# Patient Record
Sex: Male | Born: 1985 | Race: White | Hispanic: No | Marital: Single | Smoking: Current every day smoker
Health system: Southern US, Community
[De-identification: ages and names within clinical notes are randomized; demographics above are authoritative.]

---

## 2013-07-03 ENCOUNTER — Emergency Department (INDEPENDENT_AMBULATORY_CARE_PROVIDER_SITE_OTHER): Payer: 59

## 2013-07-03 ENCOUNTER — Encounter (HOSPITAL_COMMUNITY): Payer: Self-pay | Admitting: Emergency Medicine

## 2013-07-03 ENCOUNTER — Emergency Department (HOSPITAL_COMMUNITY)
Admission: EM | Admit: 2013-07-03 | Discharge: 2013-07-03 | Disposition: A | Discharge status: Home / Self Care | Payer: 59 | Source: Home / Self Care | Attending: Family Medicine | Admitting: Family Medicine

## 2013-07-03 DIAGNOSIS — I951 Orthostatic hypotension: Secondary | ICD-10-CM

## 2013-07-03 DIAGNOSIS — J111 Influenza due to unidentified influenza virus with other respiratory manifestations: Secondary | ICD-10-CM

## 2013-07-03 DIAGNOSIS — R69 Illness, unspecified: Secondary | ICD-10-CM

## 2013-07-03 LAB — POCT RAPID STREP A: STREPTOCOCCUS, GROUP A SCREEN (DIRECT): NEGATIVE

## 2013-07-03 MED ORDER — GUAIFENESIN-CODEINE 100-10 MG/5ML PO SOLN: 5.0000 mL | Freq: Every evening | ORAL | Status: DC | PRN

## 2013-07-03 MED ORDER — SODIUM CHLORIDE 0.9 % IV BOLUS (SEPSIS)
1000.0000 mL | Freq: Once | INTRAVENOUS | Status: AC
  Administered 2013-07-03: 1000 mL via INTRAVENOUS

## 2013-07-03 MED ORDER — IPRATROPIUM BROMIDE 0.06 % NA SOLN: 2.0000 | Freq: Four times a day (QID) | NASAL | Status: DC

## 2013-07-03 NOTE — Discharge Instructions (Signed)
Thank you for coming in today. Take Tylenol or ibuprofen. Use Atrovent nasal spray. His containing cough medication as needed. To the emergency room or come back if getting worse. Followup with your primary care provider. Drink plenty of fluids. Call or go to the emergency room if you get worse, have trouble breathing, have chest pains, or palpitations.

## 2013-07-03 NOTE — ED Notes (Signed)
IV bolus continues infusing.  Comfort measures offered.

## 2013-07-03 NOTE — ED Notes (Signed)
Fevers up to 103, productive cough that is now non-productive, sore throat since Friday.  Emesis x 2.  Has been taking IBU, Alka Seltzer and Night/Day Combo Cold Med.  Last dose IBU @ 1330 today.

## 2013-07-03 NOTE — ED Provider Notes (Signed)
William LovelyMatthew B Drake is a 28 y.o. male who presents to Urgent Care today for fevers chills body aches congestion and a mildly productive cough. This is also associated with 2 episodes of vomiting earlier in the week. Patient has been ill now for 3 days. No shortness of breath or diarrhea her current vomiting. No chest pains palpitations or abdominal pain. Patient feels well otherwise. He did not receive a flu vaccination yet this year.   History reviewed. No pertinent past medical history. History  Substance Use Topics  . Smoking status: Current Every Day Smoker  . Smokeless tobacco: Not on file  . Alcohol Use: Yes   ROS as above Medications reviewed. No current facility-administered medications for this encounter.   Current Outpatient Prescriptions  Medication Sig Dispense Refill  . guaiFENesin-codeine 100-10 MG/5ML syrup Take 5 mLs by mouth at bedtime as needed for cough.  120 mL  0  . ipratropium (ATROVENT) 0.06 % nasal spray Place 2 sprays into both nostrils 4 (four) times daily.  15 mL  1    Exam:  BP 119/68  Pulse 100  Temp(Src) 98.5 F (36.9 C) (Oral)  Resp 18  SpO2 100% Filed Vitals:   07/03/13 1725 07/03/13 1735 07/03/13 1736 07/03/13 1855  BP: 122/67 124/89 90/60 119/68  Pulse: 104 104 143 100  Temp:    98.5 F (36.9 C)  TempSrc:    Oral  Resp:      SpO2:    100%   Heart rate was initially 143 beats per minute.   Gen: Well NAD HEENT: EOMI,  MMM posterior pharynx normal-appearing. Tympanic membranes are normal appearing. Lungs: Normal work of breathing. CTABL Heart: Tachycardia but regular no MRG Abd: NABS, Soft. NT, ND Exts: Non edematous BL  LE, warm and well perfused.   Patient was given a 1 L normal saline bolus and had normalization of heart rate  Results for orders placed during the hospital encounter of 07/03/13 (from the past 24 hour(s))  POCT RAPID STREP A ([facility] ONLY)     Status: None   Collection Time    07/03/13  5:29 PM      Result Value  Range   Streptococcus, Group A Screen (Direct) NEGATIVE  NEGATIVE   Dg Chest 2 View  07/03/2013   CLINICAL DATA:  FEVER cough, smoker for 10 years  EXAM: CHEST  2 VIEW  COMPARISON:  No comparisons  FINDINGS: There is left mid lung linear airspace disease likely representing discoid atelectasis versus scarring. There is no focal parenchymal opacity, pleural effusion, or pneumothorax. The heart and mediastinal contours are unremarkable.  The osseous structures are unremarkable.  IMPRESSION: No active cardiopulmonary disease.   Electronically Signed   By: Elige KoHetal  Patel   On: 07/03/2013 17:52    Assessment and Plan: 28 y.o. male with orthostatic hypotension with influenza-like illness. Patient's heart rate improved significantly with 1 L normal saline bolus. Plan to treat symptomatically with codeine cough medication ipratropium nasal spray, and ibuprofen or Tylenol. Followup with primary care provider. Discussed warning signs or symptoms. Please see discharge instructions. Patient expresses understanding.      Rodolph BongEvan S Corey, MD 07/03/13 1900

## 2013-07-03 NOTE — ED Notes (Signed)
Comfort measures provided ?

## 2013-07-06 LAB — CULTURE, GROUP A STREP

## 2014-04-12 ENCOUNTER — Ambulatory Visit: Payer: Self-pay | Admitting: Family Medicine

## 2016-11-10 ENCOUNTER — Emergency Department (HOSPITAL_COMMUNITY): Payer: 59

## 2016-11-10 ENCOUNTER — Encounter (HOSPITAL_COMMUNITY): Payer: Self-pay

## 2016-11-10 ENCOUNTER — Encounter (HOSPITAL_COMMUNITY): Payer: Self-pay | Admitting: Emergency Medicine

## 2016-11-10 ENCOUNTER — Ambulatory Visit (HOSPITAL_COMMUNITY)
Admission: EM | Admit: 2016-11-10 | Discharge: 2016-11-10 | Disposition: A | Discharge status: Nursing Home (Includes ICF) | Payer: 59 | Attending: Internal Medicine | Admitting: Internal Medicine

## 2016-11-10 ENCOUNTER — Emergency Department (HOSPITAL_COMMUNITY)
Admission: EM | Admit: 2016-11-10 | Discharge: 2016-11-11 | Disposition: A | Discharge status: Home / Self Care | Payer: 59 | Attending: Emergency Medicine | Admitting: Emergency Medicine

## 2016-11-10 DIAGNOSIS — F17200 Nicotine dependence, unspecified, uncomplicated: Secondary | ICD-10-CM | POA: Diagnosis not present

## 2016-11-10 DIAGNOSIS — R112 Nausea with vomiting, unspecified: Secondary | ICD-10-CM | POA: Diagnosis not present

## 2016-11-10 DIAGNOSIS — R1031 Right lower quadrant pain: Secondary | ICD-10-CM | POA: Diagnosis present

## 2016-11-10 DIAGNOSIS — R1084 Generalized abdominal pain: Secondary | ICD-10-CM | POA: Insufficient documentation

## 2016-11-10 LAB — CBC
HCT: 48 % (ref 39.0–52.0)
Hemoglobin: 16.1 g/dL (ref 13.0–17.0)
MCH: 30.4 pg (ref 26.0–34.0)
MCHC: 33.5 g/dL (ref 30.0–36.0)
MCV: 90.7 fL (ref 78.0–100.0)
Platelets: 314 10*3/uL (ref 150–400)
RBC: 5.29 MIL/uL (ref 4.22–5.81)
RDW: 13.2 % (ref 11.5–15.5)
WBC: 12.9 10*3/uL — ABNORMAL HIGH (ref 4.0–10.5)

## 2016-11-10 LAB — URINALYSIS, ROUTINE W REFLEX MICROSCOPIC
Bilirubin Urine: NEGATIVE
Glucose, UA: NEGATIVE mg/dL
Hgb urine dipstick: NEGATIVE
Ketones, ur: NEGATIVE mg/dL
LEUKOCYTES UA: NEGATIVE
NITRITE: NEGATIVE
PH: 6 (ref 5.0–8.0)
Protein, ur: NEGATIVE mg/dL
Specific Gravity, Urine: 1.031 — ABNORMAL HIGH (ref 1.005–1.030)

## 2016-11-10 LAB — COMPREHENSIVE METABOLIC PANEL
ALT: 57 U/L (ref 17–63)
ANION GAP: 10 (ref 5–15)
AST: 31 U/L (ref 15–41)
Albumin: 4.5 g/dL (ref 3.5–5.0)
Alkaline Phosphatase: 104 U/L (ref 38–126)
BILIRUBIN TOTAL: 0.9 mg/dL (ref 0.3–1.2)
BUN: 13 mg/dL (ref 6–20)
CO2: 27 mmol/L (ref 22–32)
Calcium: 9.1 mg/dL (ref 8.9–10.3)
Chloride: 102 mmol/L (ref 101–111)
Creatinine, Ser: 1.03 mg/dL (ref 0.61–1.24)
GFR calc Af Amer: >60 mL/min (ref >60)
GFR calc non Af Amer: >60 mL/min (ref >60)
Glucose, Bld: 101 mg/dL — ABNORMAL HIGH (ref 65–99)
Potassium: 4.3 mmol/L (ref 3.5–5.1)
Sodium: 139 mmol/L (ref 135–145)
TOTAL PROTEIN: 7.3 g/dL (ref 6.5–8.1)

## 2016-11-10 LAB — LIPASE, BLOOD: LIPASE: 17 U/L (ref 11–51)

## 2016-11-10 MED ORDER — IOPAMIDOL (ISOVUE-300) INJECTION 61%
100.0000 mL | Freq: Once | INTRAVENOUS | Status: AC | PRN
  Administered 2016-11-10: 100 mL via INTRAVENOUS

## 2016-11-10 MED ORDER — ONDANSETRON 4 MG PO TBDP
8.0000 mg | ORAL_TABLET | Freq: Once | ORAL | Status: AC
Start: 2016-11-10 — End: 2016-11-10
  Administered 2016-11-10: 8 mg via ORAL
  Filled 2016-11-10: qty 2

## 2016-11-10 MED ORDER — PANTOPRAZOLE SODIUM 40 MG IV SOLR
40.0000 mg | Freq: Once | INTRAVENOUS | Status: AC
  Administered 2016-11-10: 40 mg via INTRAVENOUS
  Filled 2016-11-10: qty 40

## 2016-11-10 NOTE — ED Notes (Signed)
CT in 30 more minutes, pt advised

## 2016-11-10 NOTE — ED Notes (Addendum)
Pt still feeling nauseous after Zofran and taking small sips of fluids.  PA [place] notified

## 2016-11-10 NOTE — ED Notes (Signed)
Pt aware of need for urine. Pt given urinal

## 2016-11-10 NOTE — ED Provider Notes (Signed)
[facility] Provider Note   CSN: [id number] Arrival date & time: 11/10/16  1248     History   Chief Complaint Chief Complaint  Patient presents with  . Abdominal Pain    HPI William Drake is a 31 y.o. male.  Patient reports from urgent care with right lower quadrant abdominal pain and post prandial emesis that began on Saturday. Patient states he has a normal appetite and attempts to eat a meal but has immediate nausea and forceful vomiting following meal. States abdominal pain is achy, mild, intermittent. Patient states he has associated fevers, and red dots on face from vomiting.. Reports his last BM was this morning. Reports recent illness of bronchitis last week. No history of abdominal surgery. States he smokes 6-7 cigarettes per day.      History reviewed. No pertinent past medical history.  There are no active problems to display for this patient.   History reviewed. No pertinent surgical history.     Home Medications    Prior to Admission medications   Medication Sig Start Date End Date Taking? Authorizing Provider  albuterol (PROVENTIL HFA;VENTOLIN HFA) 108 (90 Base) MCG/ACT inhaler Inhale 1-2 puffs into the lungs every 6 (six) hours as needed for wheezing or shortness of breath.   Yes [provider]  omeprazole (PRILOSEC) 20 MG capsule Take 1 capsule (20 mg total) by mouth daily. 11/11/16   Russo, [place] N, PA-C  ondansetron (ZOFRAN ODT) 8 MG disintegrating tablet Take 1 tablet (8 mg total) by mouth every 8 (eight) hours as needed for nausea or vomiting. 11/11/16   Russo, [place] N, PA-C    Family History No family history on file.  Social History Social History  Substance Use Topics  . Smoking status: Current Every Day Smoker  . Smokeless tobacco: Never Used  . Alcohol use Yes     Allergies   Patient has no known allergies.   Review of Systems Review of Systems  Constitutional: Positive for fever. Negative for appetite change.   Gastrointestinal: Positive for abdominal pain, nausea and vomiting.  Genitourinary: Negative for difficulty urinating, dysuria and frequency.  Allergic/Immunologic: Negative for immunocompromised state.     Physical Exam Updated Vital Signs BP (!) 107/53   Pulse 82   Temp 98.1 F (36.7 C) (Oral)   Resp 18   Ht 5\' 7"  (1.702 m)   Wt 112.5 kg   SpO2 97%   BMI 38.84 kg/m   Physical Exam  Constitutional: He appears well-developed and well-nourished. No distress.  HENT:  Head: Normocephalic and atraumatic.  Petechiae noted on forehead, eyelids, cheeks  Eyes: Conjunctivae are normal.  Cardiovascular: Normal rate, regular rhythm, normal heart sounds and intact distal pulses.  Exam reveals no gallop and no friction rub.   No murmur heard. Pulmonary/Chest: Effort normal and breath sounds normal. No respiratory distress. He has no wheezes. He has no rales.  Abdominal: Normal appearance and bowel sounds are normal. He exhibits no distension. There is tenderness in the right lower quadrant, epigastric area, periumbilical area and left lower quadrant. There is rebound and tenderness at McBurney's point. There is no rigidity, no guarding and negative Murphy's sign.  Tenderness is mild  Psychiatric: He has a normal mood and affect. His behavior is normal.  Nursing note and vitals reviewed.    ED Treatments / Results  Labs (all labs ordered are listed, but only abnormal results are displayed) Labs Reviewed  COMPREHENSIVE METABOLIC PANEL - Abnormal; Notable for the following:  Result Value   Glucose, Bld 101 (*)    All other components within normal limits  CBC - Abnormal; Notable for the following:    WBC 12.9 (*)    All other components within normal limits  URINALYSIS, ROUTINE W REFLEX MICROSCOPIC - Abnormal; Notable for the following:    Color, Urine AMBER (*)    Specific Gravity, Urine 1.031 (*)    All other components within normal limits  LIPASE, BLOOD    EKG  EKG  Interpretation None       Radiology Ct Abdomen Pelvis W Contrast  Result Date: 11/10/2016 CLINICAL DATA:  Initial evaluation for acute right lower quadrant abdominal pain. EXAM: CT ABDOMEN AND PELVIS WITH CONTRAST TECHNIQUE: Multidetector CT imaging of the abdomen and pelvis was performed using the standard protocol following bolus administration of intravenous contrast. CONTRAST:  100mL ISOVUE-300 IOPAMIDOL (ISOVUE-300) INJECTION 61% COMPARISON:  None available. FINDINGS: Lower chest: Visualized lung bases are clear. Hepatobiliary: Diffuse hypoattenuation liver consistent with steatosis. Focal fatty sparing noted within the left hepatic lobe. Liver otherwise unremarkable. Gallbladder within normal limits. No biliary dilatation. Pancreas: Pancreas within normal limits. Spleen: Spleen within normal limits. Adrenals/Urinary Tract: Adrenal glands are normal. Kidneys equal in size with symmetric enhancement. No nephrolithiasis, hydronephrosis, or focal enhancing renal mass. No hydroureter. Bladder largely decompressed without acute abnormality. Stomach/Bowel: Stomach within normal limits. No evidence for bowel obstruction. Appendix well visualized within the right lower quadrant and is within normal limits without acute inflammatory changes to suggest acute appendicitis. No abnormal wall thickening, mucosal enhancement, or inflammatory fat stranding seen elsewhere about the bowels. Vascular/Lymphatic: Normal intravascular enhancement seen throughout the intra-abdominal aorta and its branch vessels. No adenopathy. Reproductive: Prostate normal. Other: No free air or fluid. Musculoskeletal: No acute osseous abnormality. No worrisome lytic or blastic osseous lesions. IMPRESSION: 1. No CT evidence for acute intra-abdominal or pelvic process. 2. Normal appendix. 3. Hepatic steatosis. Electronically Signed   By: Rise MuBenjamin  McClintock M.D.   On: 11/10/2016 21:43    Procedures Procedures (including critical care  time)  Medications Ordered in ED Medications  iopamidol (ISOVUE-300) 61 % injection 100 mL (100 mLs Intravenous Contrast Given 11/10/16 2121)  pantoprazole (PROTONIX) injection 40 mg (40 mg Intravenous Given 11/10/16 2240)  ondansetron (ZOFRAN-ODT) disintegrating tablet 8 mg (8 mg Oral Given 11/10/16 2240)     Initial Impression / Assessment and Plan / ED Course  I have reviewed the triage vital signs and the nursing notes.  Pertinent labs & imaging results that were available during my care of the patient were reviewed by me and considered in my medical decision making (see chart for details).  Clinical Course as of Nov 12 6  Mon Nov 10, 2016  2314 Reevaluated pt after protonix and zofran. Pt with improved abd pain. Will PO challenge  [JR]    Clinical Course User Index [JR] Russo, [place] N, PA-C    Pt w post-prandial vomiting and generalized abdominal pain. Patient is nontoxic, nonseptic appearing, in no apparent distress. Patient's pain and other symptoms adequately managed in emergency department. Labs wnl. CT abd/pelvis without evidence of appendicitis, bowel obstruction, or other acute pathology. VSS.  Patient does not meet the SIRS or Sepsis criteria. Protonix and zofran given in ED. Pt tolerating PO liquids. Will discharge home with prilosec and zofran. Pt to follow up with GI within 1 week if sx persist. Pt safe for discharge.  Patient discussed with Dr. Donnald GarrePfeiffer.  Discussed results, findings, treatment and follow up. Patient advised  of return precautions. Patient verbalized understanding and agreed with plan.    Final Clinical Impressions(s) / ED Diagnoses   Final diagnoses:  Generalized abdominal pain  Nausea and vomiting in adult    New Prescriptions New Prescriptions   OMEPRAZOLE (PRILOSEC) 20 MG CAPSULE    Take 1 capsule (20 mg total) by mouth daily.   ONDANSETRON (ZOFRAN ODT) 8 MG DISINTEGRATING TABLET    Take 1 tablet (8 mg total) by mouth every 8 (eight) hours  as needed for nausea or vomiting.     Russo, [place] N, PA-C 11/11/16 Pernell Dupre    Arby Barrette, MD 11/11/16 [phone]

## 2016-11-10 NOTE — ED Notes (Signed)
Pt giving urine sample

## 2016-11-10 NOTE — Discharge Instructions (Signed)
Go to the Emergency department for evaluation  

## 2016-11-10 NOTE — ED Provider Notes (Signed)
CSN: [id number]     Arrival date & time 11/10/16  1026 History   None    Chief Complaint  Patient presents with  . Abdominal Pain   (Consider location/radiation/quality/duration/timing/severity/associated sxs/prior Treatment) The history is provided by the patient. No language interpreter was used.  Abdominal Pain  Pain location:  RLQ Pain quality: aching   Pain radiates to:  Does not radiate Pain severity:  Moderate Duration:  3 days Timing:  Constant Progression:  Worsening Chronicity:  New Relieved by:  Nothing Worsened by:  Nothing Pt has had vomitting and abdominal pain since Saturday.  Pt reports pain is worse in right lower abdomen.  History reviewed. No pertinent past medical history. History reviewed. No pertinent surgical history. No family history on file. Social History  Substance Use Topics  . Smoking status: Current Every Day Smoker  . Smokeless tobacco: Not on file  . Alcohol use Yes    Review of Systems  Gastrointestinal: Positive for abdominal pain.  All other systems reviewed and are negative.   Allergies  Patient has no known allergies.  Home Medications   Prior to Admission medications   Medication Sig Start Date End Date Taking? Authorizing Provider  guaiFENesin-codeine 100-10 MG/5ML syrup Take 5 mLs by mouth at bedtime as needed for cough. 07/03/13   Rodolph Bongorey, Evan S, MD  ipratropium (ATROVENT) 0.06 % nasal spray Place 2 sprays into both nostrils 4 (four) times daily. 07/03/13   Rodolph Bongorey, Evan S, MD   Meds Ordered and Administered this Visit  Medications - No data to display  BP 118/74 (BP Location: Left Arm)   Pulse 94   Temp 99.2 F (37.3 C) (Oral)   Resp 16   SpO2 98%  No data found.   Physical Exam  Constitutional: He appears well-developed and well-nourished.  HENT:  Head: Normocephalic and atraumatic.  Eyes: Conjunctivae are normal.  Neck: Neck supple.  Cardiovascular: Normal rate and regular rhythm.   No murmur  heard. Pulmonary/Chest: Effort normal and breath sounds normal. No respiratory distress.  Abdominal: Soft. There is no tenderness.  Tender right lower quadrant  Musculoskeletal: He exhibits no edema.  Neurological: He is alert.  Skin: Skin is warm and dry.  Petechia face and forearm  Psychiatric: He has a normal mood and affect.  Nursing note and vitals reviewed.   Urgent Care Course     Procedures (including critical care time)  Labs Review Labs Reviewed - No data to display  Imaging Review No results found.   Visual Acuity Review  Right Eye Distance:   Left Eye Distance:   Bilateral Distance:    Right Eye Near:   Left Eye Near:    Bilateral Near:         MDM   1. Right lower quadrant abdominal pain    Pt to Ed for evalaution    Elson AreasSofia, Jood Retana K, [place]-C 11/10/16 1238

## 2016-11-10 NOTE — ED Triage Notes (Signed)
Onset Saturday of abdominal pain.  [facility]  Was given cough medicine, antibiotics.  Was feeling better around Thursday and Friday.  Patient did have a few beers and a cup cake.  Immediately vomited.  Saturday after eating had nausea and vomiting.  Symptoms continue today

## 2016-11-10 NOTE — ED Triage Notes (Signed)
Pt is coming from UC after being assessed there and sent here. Pt was diagnosed with Bronchitis over a week ago and treated for this. Pt reports that three days ago he started to have abdominal tenderness and would vomit after everything that he ate. Minor diarrhea noted. Pt reports abdomen is tender to touch and he has continued to have painful coughing.

## 2016-11-10 NOTE — ED Notes (Signed)
Patient transported to CT 

## 2016-11-11 MED ORDER — OMEPRAZOLE 20 MG PO CPDR: 20.0000 mg | DELAYED_RELEASE_CAPSULE | Freq: Every day | ORAL | 0 refills | Status: AC

## 2016-11-11 MED ORDER — ONDANSETRON 8 MG PO TBDP: 8.0000 mg | ORAL_TABLET | Freq: Three times a day (TID) | ORAL | 0 refills | Status: AC | PRN

## 2016-11-11 NOTE — Discharge Instructions (Signed)
Please read instructions below. Take Prilosec once per day for acid reflux. You can take Zofran every 8 hours as needed for nausea. Slowly start drinking clear liquids. Then you can move to bland solid foods as tolerated. Schedule an appointment within 1 week with gastroenterology to follow up if your symptoms persist. Return to the ER if you stop having bowel movements, are unable to keep liquids down, or for other new or worsening symptoms.

## 2016-11-11 NOTE — ED Notes (Signed)
Pt stable, ambulatory, states understanding of discharge instructions 

## 2018-02-08 IMAGING — CT CT ABD-PELV W/ CM
2 of 4 series · 16 of 46 positions shown, 18 images · IV contrast (Omni 300)
Comparison: None available.

CLINICAL DATA: Initial evaluation for acute right lower quadrant
abdominal pain.

EXAM:
CT ABDOMEN AND PELVIS WITH CONTRAST
TECHNIQUE: Multidetector CT imaging of the abdomen and pelvis was performed
using the standard protocol following bolus administration of
intravenous contrast.
CONTRAST:  100mL 28SS3X-8TT IOPAMIDOL (28SS3X-8TT) INJECTION 61%

[Series 2: a/p w/ 5mm · axial · 0.95mm/px · z∈[+957,+1407]mm · 13 of 100 slices shown, 15 images]
[im 5/100  soft-tissue]
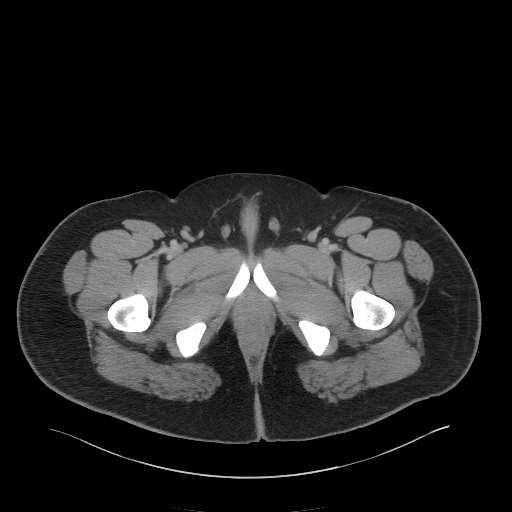
[im 5/100  bone]
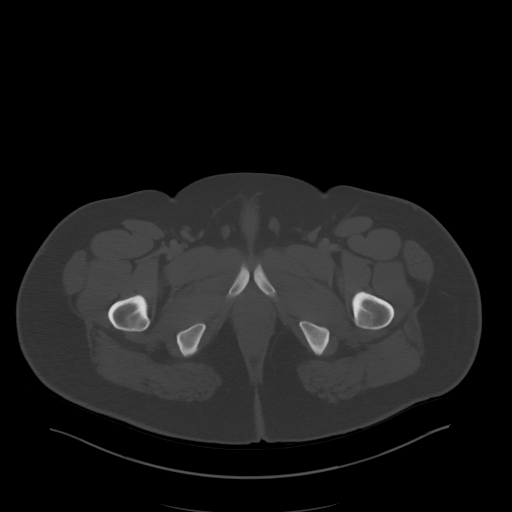
[im 13/100  soft-tissue]
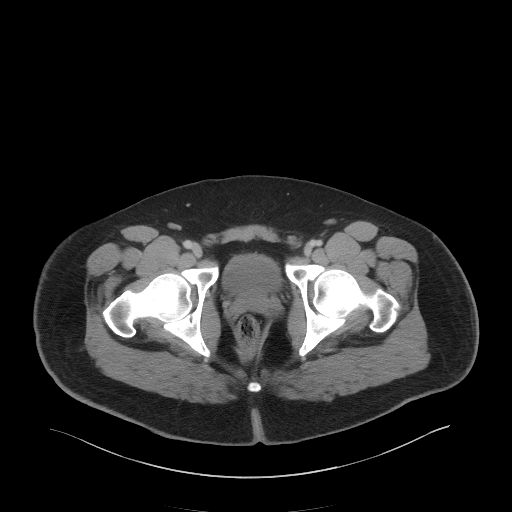
[im 22/100  soft-tissue]
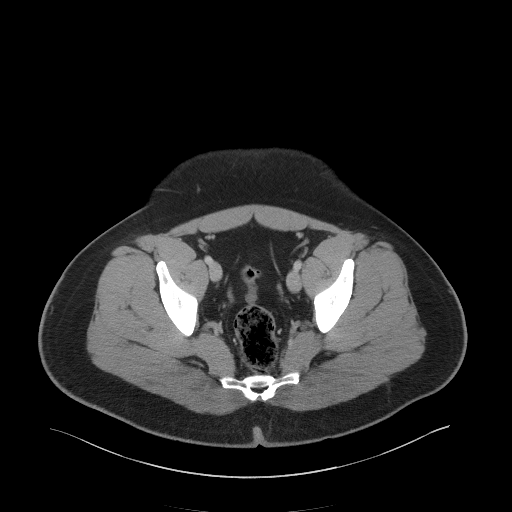
[im 26/100  soft-tissue]
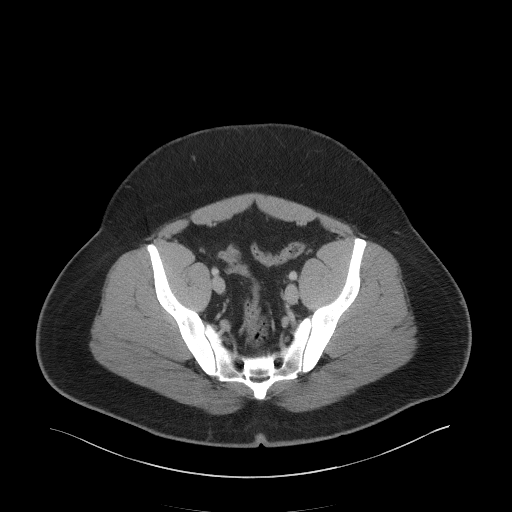
[im 35/100  soft-tissue]
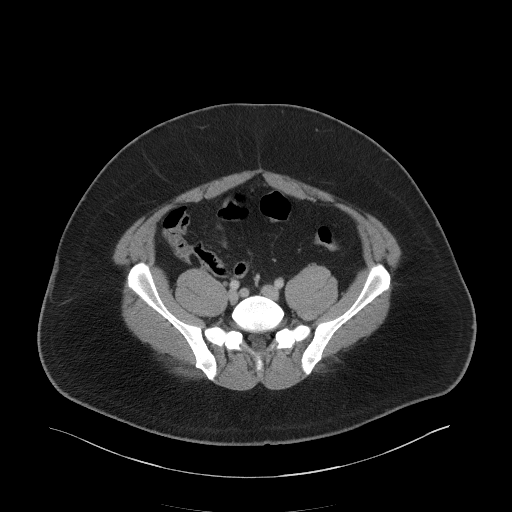
[im 44/100  soft-tissue]
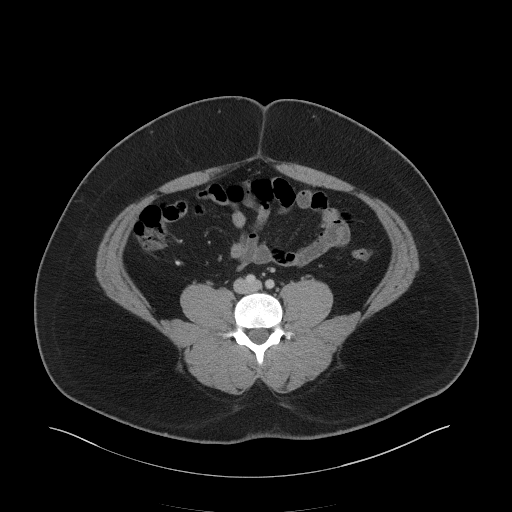
[im 52/100  soft-tissue]
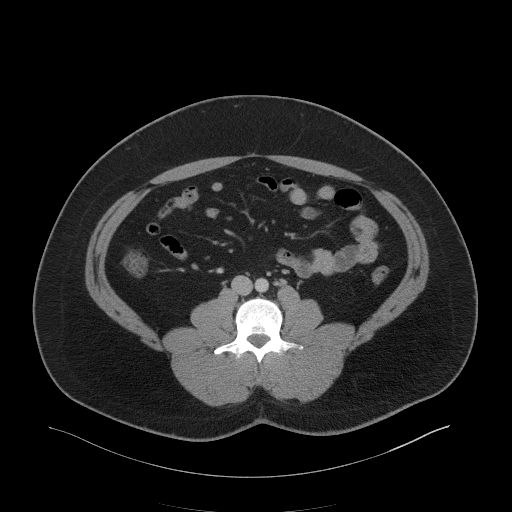
[im 56/100  soft-tissue]
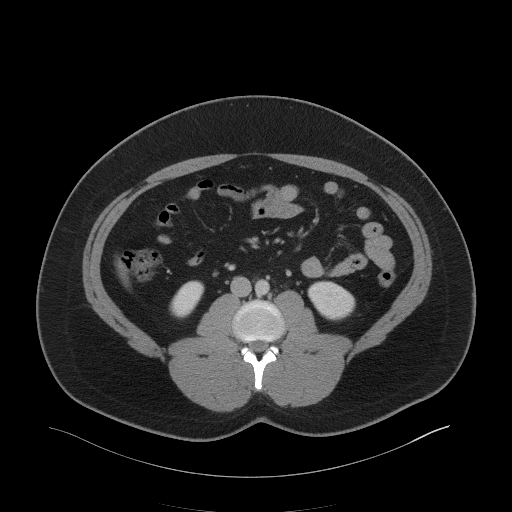
[im 65/100  soft-tissue]
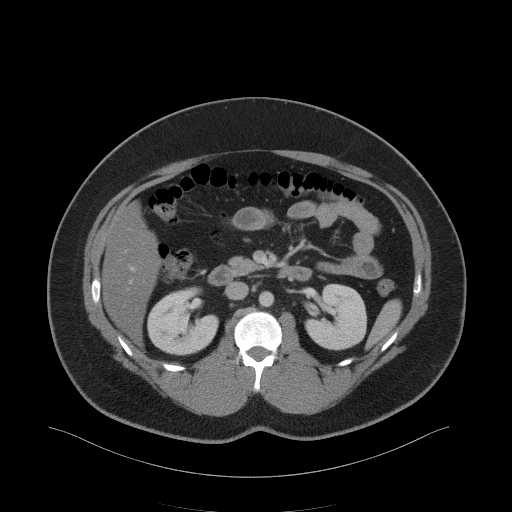
[im 65/100  bone]
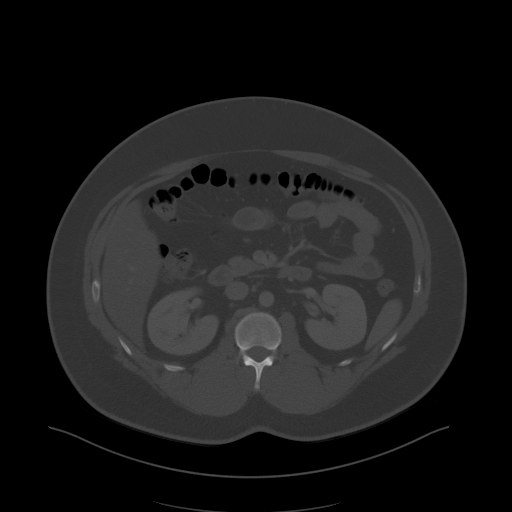
[im 74/100  soft-tissue]
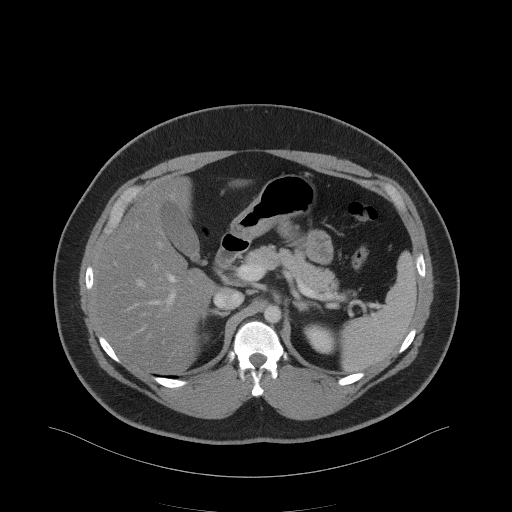
[im 78/100  soft-tissue]
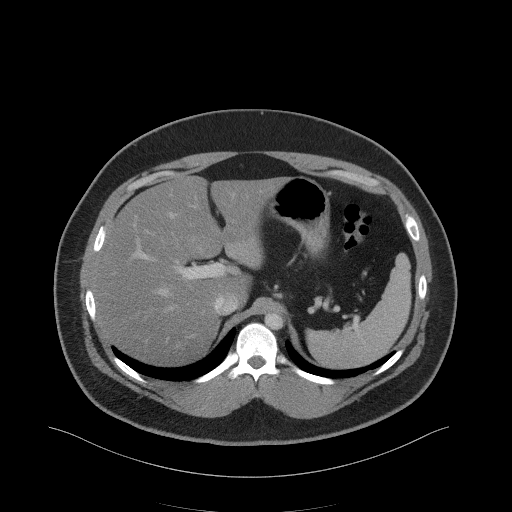
[im 87/100  soft-tissue]
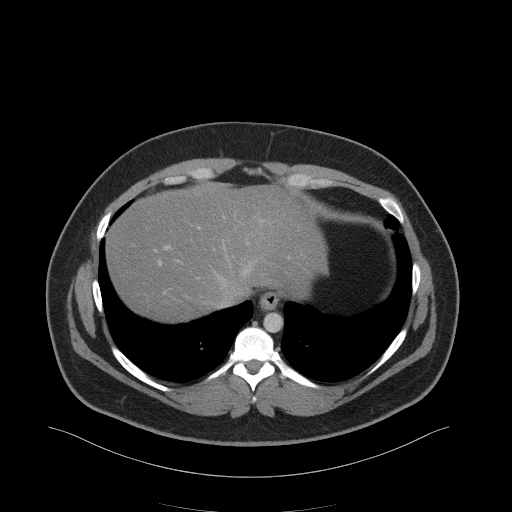
[im 95/100  soft-tissue]
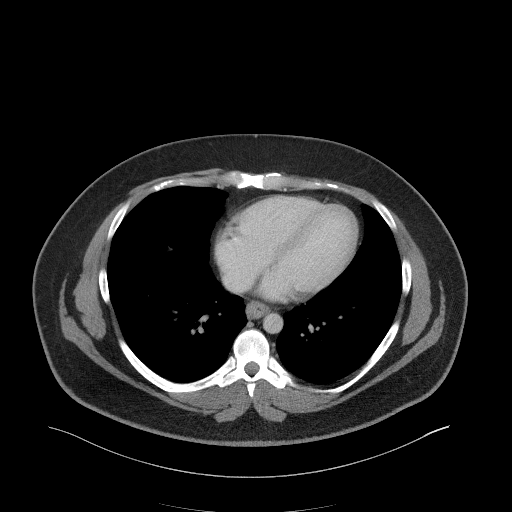

[Series 5: a/p w/ cor · coronal · 0.97mm/px · 3 of 181 slices shown]
[im 61/181  soft-tissue]
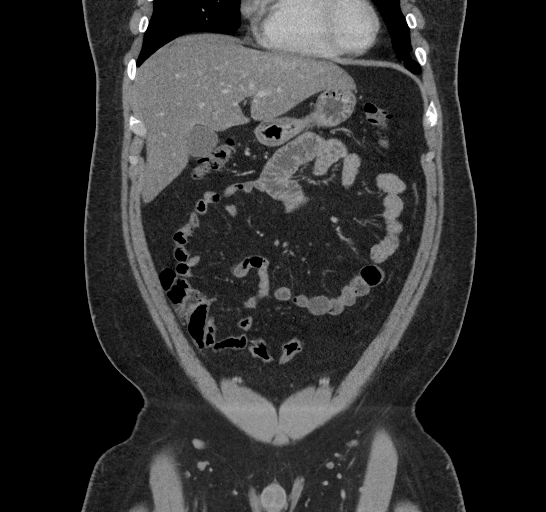
[im 81/181  soft-tissue]
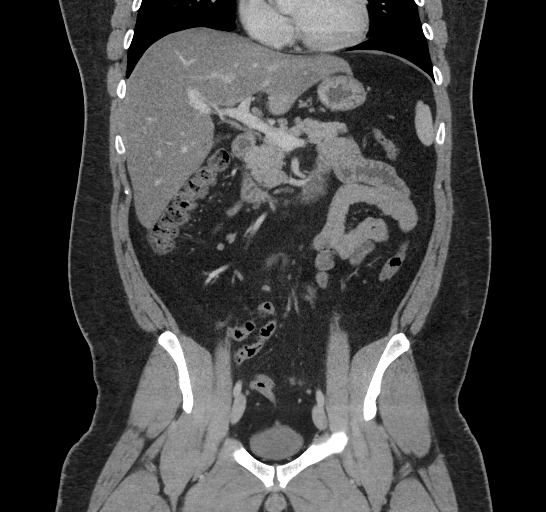
[im 101/181  soft-tissue]
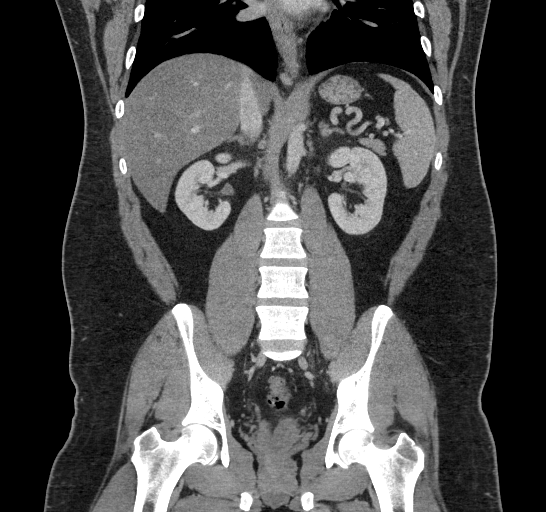

[16 of 46 positions shown; findings below may reference images not displayed]

FINDINGS: Lower chest: Visualized lung bases are clear.

Hepatobiliary: Diffuse hypoattenuation liver consistent with
steatosis. Focal fatty sparing noted within the left hepatic lobe.
Liver otherwise unremarkable. Gallbladder within normal limits. No
biliary dilatation.

Pancreas: Pancreas within normal limits.

Spleen: Spleen within normal limits.

Adrenals/Urinary Tract: Adrenal glands are normal. Kidneys equal in
size with symmetric enhancement. No nephrolithiasis, hydronephrosis,
or focal enhancing renal mass. No hydroureter. Bladder largely
decompressed without acute abnormality.

Stomach/Bowel: Stomach within normal limits. No evidence for bowel
obstruction. Appendix well visualized within the right lower
quadrant and is within normal limits without acute inflammatory
changes to suggest acute appendicitis. No abnormal wall thickening,
mucosal enhancement, or inflammatory fat stranding seen elsewhere
about the bowels.

Vascular/Lymphatic: Normal intravascular enhancement seen throughout
the intra-abdominal aorta and its branch vessels. No adenopathy.

Reproductive: Prostate normal.

Other: No free air or fluid.

Musculoskeletal: No acute osseous abnormality. No worrisome lytic or
blastic osseous lesions.
IMPRESSION: 1. No CT evidence for acute intra-abdominal or pelvic process.
2. Normal appendix.
3. Hepatic steatosis.

## 2019-04-13 ENCOUNTER — Other Ambulatory Visit: Payer: Self-pay

## 2019-04-13 DIAGNOSIS — Z20822 Contact with and (suspected) exposure to covid-19: Secondary | ICD-10-CM

## 2019-04-14 LAB — NOVEL CORONAVIRUS, NAA: SARS-CoV-2, NAA: NOT DETECTED

## 2019-04-15 ENCOUNTER — Telehealth: Payer: Self-pay | Admitting: General Practice

## 2019-04-15 NOTE — Telephone Encounter (Signed)
Patient informed of negative covid 19 result. He verbalized understanding.  ° °

## 2019-05-30 ENCOUNTER — Other Ambulatory Visit: Payer: Self-pay

## 2019-05-30 DIAGNOSIS — Z20822 Contact with and (suspected) exposure to covid-19: Secondary | ICD-10-CM

## 2019-05-31 LAB — NOVEL CORONAVIRUS, NAA: SARS-CoV-2, NAA: NOT DETECTED

## 2019-07-07 ENCOUNTER — Ambulatory Visit: Payer: Self-pay | Attending: Internal Medicine

## 2019-07-07 DIAGNOSIS — Z20822 Contact with and (suspected) exposure to covid-19: Secondary | ICD-10-CM

## 2019-07-07 DIAGNOSIS — U071 COVID-19: Secondary | ICD-10-CM | POA: Insufficient documentation

## 2019-07-09 ENCOUNTER — Telehealth: Payer: Self-pay | Admitting: Adult Health

## 2019-07-09 LAB — NOVEL CORONAVIRUS, NAA: SARS-CoV-2, NAA: DETECTED — AB

## 2019-07-09 NOTE — Telephone Encounter (Signed)
Patient called about Positive Covid test.  2 patient identifiers confirmed.  Date Tested: 07/07/2019  Date of Symptom onset: 07/05/2019   Symptoms: Mild     Isolation Recommendations:  Patient understands the needs to stay in isolation for a total of 10 days from onset of symptom or 14 days total from date of testing if no symptom. Reviewed Masking.    Supportive Care Recommendations: Encouraged plenty of fluid intake, Tylenol per package directions, and to remain as active as possible.    Patient knows the health department may be in touch.    I answered all of patient's questions and all concerns addressed.  ER precautions reviewed.  Gave information on My chart.    Time Spent: 6 minutes  Lillard Anes, NP
